# Patient Record
Sex: Male | Born: 1985 | Race: White | Hispanic: No | Marital: Married | Smoking: Never smoker
Health system: Southern US, Community
[De-identification: ages and names within clinical notes are randomized; demographics above are authoritative.]

---
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
## 2009-01-16 ENCOUNTER — Emergency Department: Payer: Self-pay | Admitting: Emergency Medicine

## 2012-12-30 ENCOUNTER — Emergency Department: Payer: Self-pay | Admitting: Emergency Medicine

## 2013-01-07 ENCOUNTER — Emergency Department: Payer: Self-pay | Admitting: Emergency Medicine
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
## 2016-07-25 ENCOUNTER — Other Ambulatory Visit: Payer: Self-pay | Admitting: *Deleted

## 2016-07-25 DIAGNOSIS — Z0000 Encounter for general adult medical examination without abnormal findings: Secondary | ICD-10-CM

## 2016-07-25 NOTE — Progress Notes (Signed)
Hep B Titer needed to verify immunity after Hep B vaccine series. Reports labs drawn recently at [facility]. Unsure if Hep B Titer was one of them. Care Everywhere utilized to check. Only Hep C drawn. B titer drawn today. Manual lab requisition completed. Will f/u with pt after results received.

## 2016-11-05 ENCOUNTER — Ambulatory Visit: Payer: Self-pay | Admitting: Physician Assistant

## 2016-11-05 ENCOUNTER — Encounter: Payer: Self-pay | Admitting: Physician Assistant

## 2016-11-05 VITALS — BP 130/90 | HR 87 | Temp 99.1°F

## 2016-11-05 DIAGNOSIS — J029 Acute pharyngitis, unspecified: Secondary | ICD-10-CM

## 2016-11-05 LAB — POCT RAPID STREP A (OFFICE): Rapid Strep A Screen: NEGATIVE

## 2016-11-05 MED ORDER — DEXAMETHASONE SODIUM PHOSPHATE 10 MG/ML IJ SOLN
10.0000 mg | Freq: Once | INTRAMUSCULAR | Status: AC
  Administered 2016-11-05: 10 mg via INTRAMUSCULAR

## 2016-11-05 MED ORDER — AMOXICILLIN 875 MG PO TABS: 875.0000 mg | ORAL_TABLET | Freq: Two times a day (BID) | ORAL | 0 refills | Status: DC

## 2016-11-05 NOTE — Progress Notes (Signed)
S: c/o sore throat for 2 days with fever/chills, hurts to swallow, hasn't been able to eat for last 2 days, no cough or congestion, no cp/sob  O: vitals wnl, nad, tms clear, throat a little red, no exudate, tonsils are minimally enlarged, neck supple no lymph, lungs c t a, cv rrr, q strep negative  A: acute pharyngitis  P: decadron 10mg  Im, amoxil 875mg  bid, told pt we do not give narcotics for sore throats, states he understands

## 2020-10-29 ENCOUNTER — Encounter (HOSPITAL_BASED_OUTPATIENT_CLINIC_OR_DEPARTMENT_OTHER): Payer: Self-pay | Admitting: Emergency Medicine

## 2020-10-29 ENCOUNTER — Emergency Department
Admission: EM | Admit: 2020-10-29 | Discharge: 2020-10-29 | Disposition: A | Discharge status: Home / Self Care | Payer: Self-pay | Attending: Emergency Medicine | Admitting: Emergency Medicine

## 2020-10-29 ENCOUNTER — Encounter: Payer: Self-pay | Admitting: *Deleted

## 2020-10-29 ENCOUNTER — Other Ambulatory Visit: Payer: Self-pay

## 2020-10-29 DIAGNOSIS — Z5321 Procedure and treatment not carried out due to patient leaving prior to being seen by health care provider: Secondary | ICD-10-CM | POA: Insufficient documentation

## 2020-10-29 DIAGNOSIS — W268XXA Contact with other sharp object(s), not elsewhere classified, initial encounter: Secondary | ICD-10-CM | POA: Diagnosis not present

## 2020-10-29 DIAGNOSIS — S91321A Laceration with foreign body, right foot, initial encounter: Secondary | ICD-10-CM | POA: Insufficient documentation

## 2020-10-29 DIAGNOSIS — S91311A Laceration without foreign body, right foot, initial encounter: Secondary | ICD-10-CM | POA: Insufficient documentation

## 2020-10-29 DIAGNOSIS — X58XXXA Exposure to other specified factors, initial encounter: Secondary | ICD-10-CM | POA: Insufficient documentation

## 2020-10-29 DIAGNOSIS — S99921A Unspecified injury of right foot, initial encounter: Secondary | ICD-10-CM | POA: Diagnosis present

## 2020-10-29 NOTE — ED Triage Notes (Signed)
Laceration to top of R foot, unknown object. Pt is updated on tetnus w/I last month. Pt last took ibuprofen @ 1900.
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
## 2020-10-30 ENCOUNTER — Emergency Department (HOSPITAL_BASED_OUTPATIENT_CLINIC_OR_DEPARTMENT_OTHER)
Admission: EM | Admit: 2020-10-30 | Discharge: 2020-10-30 | Disposition: A | Discharge status: Home / Self Care | Payer: BC Managed Care – PPO | Attending: Emergency Medicine | Admitting: Emergency Medicine

## 2020-10-30 DIAGNOSIS — S91311A Laceration without foreign body, right foot, initial encounter: Secondary | ICD-10-CM

## 2020-10-30 MED ORDER — CEPHALEXIN 500 MG PO CAPS: 500.0000 mg | ORAL_CAPSULE | Freq: Two times a day (BID) | ORAL | 0 refills | Status: AC

## 2020-10-30 MED ORDER — LIDOCAINE-EPINEPHRINE (PF) 2 %-1:200000 IJ SOLN
20.0000 mL | Freq: Once | INTRAMUSCULAR | Status: AC
  Administered 2020-10-30: 20 mL
  Filled 2020-10-30: qty 20

## 2020-10-30 NOTE — ED Notes (Signed)
Pt verbalizes understanding of discharge instructions. Opportunity for questioning and answers were provided. Armand removed by staff, pt discharged from ED to home. Educated to pick up Rx and wound care 

## 2020-10-30 NOTE — ED Notes (Signed)
MD at bedside for lac repair
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
## 2021-03-20 ENCOUNTER — Encounter: Payer: Self-pay | Admitting: Emergency Medicine

## 2021-03-20 ENCOUNTER — Ambulatory Visit
Admission: EM | Admit: 2021-03-20 | Discharge: 2021-03-20 | Disposition: A | Discharge status: Home / Self Care | Payer: BC Managed Care – PPO | Attending: Emergency Medicine | Admitting: Emergency Medicine

## 2021-03-20 DIAGNOSIS — R350 Frequency of micturition: Secondary | ICD-10-CM | POA: Diagnosis not present

## 2021-03-20 LAB — POCT URINALYSIS DIP (MANUAL ENTRY)
Bilirubin, UA: NEGATIVE
Blood, UA: NEGATIVE
Glucose, UA: NEGATIVE mg/dL
Ketones, POC UA: NEGATIVE mg/dL
Leukocytes, UA: NEGATIVE
Nitrite, UA: NEGATIVE
Protein Ur, POC: NEGATIVE mg/dL
Spec Grav, UA: 1.01 (ref 1.010–1.025)
Urobilinogen, UA: 0.2 E.U./dL
pH, UA: 7 (ref 5.0–8.0)

## 2021-03-20 NOTE — ED Provider Notes (Signed)
[facility]    CSN: [id number] Arrival date & time: 03/20/21  1429      History   Chief Complaint Chief Complaint  Patient presents with   Urinary Frequency    HPI Fred Oneill is a 35 y.o. male.  Patient presents with 1 week history of urinary frequency and urgency.  He denies fever, chills, dysuria, hematuria, abdominal pain, flank pain, penile discharge, testicular pain, rash, or other symptoms.  No treatments at home.  No pertinent medical history.  He is sexually active in a monogamous relationship.    The history is provided by the patient.   History reviewed. No pertinent past medical history.  There are no problems to display for this patient.   History reviewed. No pertinent surgical history.     Home Medications    Prior to Admission medications   Medication Sig Start Date End Date Taking? Authorizing Provider  cephALEXin (KEFLEX) 500 MG capsule Take 1 capsule (500 mg total) by mouth 2 (two) times daily. 10/30/20   Zadie Rhine, MD  sildenafil (REVATIO) 20 MG tablet TAKE 3 TABLETS (60 MG) ORALLY DAILY AS NEEDED 1 HOUR BEFORE SEX (MAX 3/DAY) 10/29/16   [provider]    Family History History reviewed. No pertinent family history.  Social History Social History   Tobacco Use   Smoking status: Never   Smokeless tobacco: Never  Vaping Use   Vaping Use: Never used  Substance Use Topics   Alcohol use: Yes     Allergies   Sulfa antibiotics   Review of Systems Review of Systems  Constitutional:  Negative for chills and fever.  Gastrointestinal:  Negative for abdominal pain and vomiting.  Genitourinary:  Positive for frequency and urgency. Negative for dysuria, flank pain, hematuria, penile discharge and testicular pain.  Skin:  Negative for color change and rash.  All other systems reviewed and are negative.   Physical Exam Triage Vital Signs ED Triage Vitals  Enc Vitals Group     BP      Pulse      Resp      Temp       Temp src      SpO2      Weight      Height      Head Circumference      Peak Flow      Pain Score      Pain Loc      Pain Edu?      Excl. in GC?    No data found.  Updated Vital Signs BP (!) 152/86 (BP Location: Left Arm)   Pulse 72   Temp 97.8 F (36.6 C) (Oral)   Resp 18   SpO2 96%   Visual Acuity Right Eye Distance:   Left Eye Distance:   Bilateral Distance:    Right Eye Near:   Left Eye Near:    Bilateral Near:     Physical Exam Vitals and nursing note reviewed.  Constitutional:      General: He is not in acute distress.    Appearance: He is well-developed. He is not ill-appearing.  HENT:     Mouth/Throat:     Mouth: Mucous membranes are moist.  Cardiovascular:     Rate and Rhythm: Normal rate and regular rhythm.     Heart sounds: Normal heart sounds.  Pulmonary:     Effort: Pulmonary effort is normal. No respiratory distress.     Breath sounds: Normal breath sounds.  Abdominal:     General: Bowel sounds are normal.     Palpations: Abdomen is soft.     Tenderness: There is no abdominal tenderness. There is no right CVA tenderness, left CVA tenderness, guarding or rebound.  Musculoskeletal:     Cervical back: Neck supple.  Skin:    General: Skin is warm and dry.  Neurological:     Mental Status: He is alert.  Psychiatric:        Mood and Affect: Mood normal.        Behavior: Behavior normal.     UC Treatments / Results  Labs (all labs ordered are listed, but only abnormal results are displayed) Labs Reviewed  POCT URINALYSIS DIP (MANUAL ENTRY)  CYTOLOGY, (ORAL, ANAL, URETHRAL) ANCILLARY ONLY    EKG   Radiology No results found.  Procedures Procedures (including critical care time)  Medications Ordered in UC Medications - No data to display  Initial Impression / Assessment and Plan / UC Course  I have reviewed the triage vital signs and the nursing notes.  Pertinent labs & imaging results that were available during my care of  the patient were reviewed by me and considered in my medical decision making (see chart for details).   Urinary frequency.  Urine normal.  Patient obtained self swab for testing.  Discussed that we will call if test results are positive.  Discussed that he may require treatment if test results are positive.  Discussed that sexual partner may also require treatment.  Instructed patient to abstain from sexual activity for at least 7 days.  Instructed him to follow-up with PCP if symptoms are not improving.  Patient agrees to plan of care.    Final Clinical Impressions(s) / UC Diagnoses   Final diagnoses:  Urinary frequency     Discharge Instructions      Your urine does not show signs of infection.    Your other tests are pending.  If your test results are positive, we will call you.  Do not have sexual activity for at least 7 days.    Establish a primary care provider and follow up if your symptoms are not improving.           ED Prescriptions   None    PDMP not reviewed this encounter.   Sharion Balloon, NP 03/20/21 [phone]

## 2021-03-20 NOTE — Discharge Instructions (Addendum)
Your urine does not show signs of infection.    Your other tests are pending.  If your test results are positive, we will call you.  Do not have sexual activity for at least 7 days.    Establish a primary care provider and follow up if your symptoms are not improving.

## 2021-03-20 NOTE — ED Triage Notes (Signed)
Pt c/o urinary frequency and urgency x 1 week.

## 2021-03-21 LAB — CYTOLOGY, (ORAL, ANAL, URETHRAL) ANCILLARY ONLY
Chlamydia: NEGATIVE
Comment: NEGATIVE
Comment: NEGATIVE
Comment: NORMAL
Neisseria Gonorrhea: NEGATIVE
Trichomonas: NEGATIVE

## 2022-01-18 ENCOUNTER — Other Ambulatory Visit: Payer: Self-pay | Admitting: Urology

## 2022-01-18 DIAGNOSIS — N50812 Left testicular pain: Secondary | ICD-10-CM

## 2022-01-25 ENCOUNTER — Ambulatory Visit: Admission: RE | Admit: 2022-01-25 | Payer: Managed Care, Other (non HMO) | Source: Ambulatory Visit

## 2022-07-17 ENCOUNTER — Ambulatory Visit
Admission: EM | Admit: 2022-07-17 | Discharge: 2022-07-17 | Disposition: A | Discharge status: Home / Self Care | Payer: Managed Care, Other (non HMO) | Attending: Urgent Care | Admitting: Urgent Care

## 2022-07-17 DIAGNOSIS — R0789 Other chest pain: Secondary | ICD-10-CM | POA: Diagnosis not present

## 2022-07-17 NOTE — ED Provider Notes (Signed)
[name]    CSN: [id number] Arrival date & time: 07/17/22  1604      History   Chief Complaint Chief Complaint  Patient presents with   Dizziness    HPI Fred Oneill is a 37 y.o. male.    Dizziness   Presents to urgent care with report of "lightheadedness" accompanied by sensation on left side of chest that he has difficulty describing.  Symptoms starting 4 days ago.  Patient states he felt as if he was going to "pass out" earlier when sitting in a chair.  Now endorses feeling better.  Also endorses "chest pain" when taking a deep breath which is chronic and reproducible.  History reviewed. No pertinent past medical history.  There are no problems to display for this patient.   History reviewed. No pertinent surgical history.     Home Medications    Prior to Admission medications   Medication Sig Start Date End Date Taking? Authorizing Provider  cephALEXin (KEFLEX) 500 MG capsule Take 1 capsule (500 mg total) by mouth 2 (two) times daily. 10/30/20   Fred Rhine, MD  sildenafil (REVATIO) 20 MG tablet TAKE 3 TABLETS (60 MG) ORALLY DAILY AS NEEDED 1 HOUR BEFORE SEX (MAX 3/DAY) 10/29/16   [provider]    Family History History reviewed. No pertinent family history.  Social History Social History   Tobacco Use   Smoking status: Never   Smokeless tobacco: Never  Vaping Use   Vaping Use: Never used  Substance Use Topics   Alcohol use: Yes   Drug use: Never     Allergies   Sulfa antibiotics   Review of Systems Review of Systems  Neurological:  Positive for dizziness.     Physical Exam Triage Vital Signs ED Triage Vitals [07/17/22 1655]  Enc Vitals Group     BP (!) 151/105     Pulse Rate 95     Resp 18     Temp 98.3 F (36.8 C)     Temp Source Oral     SpO2 96 %     Weight      Height      Head Circumference      Peak Flow      Pain Score      Pain Loc      Pain Edu?      Excl. in GC?    No data  found.  Updated Vital Signs BP (!) 160/89 (BP Location: Left Arm)   Pulse 95   Temp 98.3 F (36.8 C) (Oral)   Resp 18   SpO2 96%   Visual Acuity Right Eye Distance:   Left Eye Distance:   Bilateral Distance:    Right Eye Near:   Left Eye Near:    Bilateral Near:     Physical Exam Vitals reviewed.  Constitutional:      Appearance: Normal appearance.  HENT:     Ears:     Comments: Negative Epley Cardiovascular:     Rate and Rhythm: Normal rate and regular rhythm.     Pulses: Normal pulses.     Heart sounds: Normal heart sounds.  Pulmonary:     Effort: Pulmonary effort is normal.     Breath sounds: Normal breath sounds.  Skin:    General: Skin is warm and dry.  Neurological:     General: No focal deficit present.     Mental Status: He is alert and oriented to person, place, and time.  Coordination: Romberg sign negative.  Psychiatric:        Mood and Affect: Mood normal.        Behavior: Behavior normal.     UC Treatments / Results  Labs (all labs ordered are listed, but only abnormal results are displayed) Labs Reviewed - No data to display  EKG   Radiology No results found.  Procedures Procedures (including critical care time)  Medications Ordered in UC Medications - No data to display  Initial Impression / Assessment and Plan / UC Course  I have reviewed the triage vital signs and the nursing notes.  Pertinent labs & imaging results that were available during my care of the patient were reviewed by me and considered in my medical decision making (see chart for details).   Patient is afebrile here without recent antipyretics. Satting well on room air. Overall is well appearing, well hydrated, without respiratory distress. Pulmonary exam is unremarkable.  Lungs CTAB without wheezing, rhonchi, rales.  ECG shows sinus rhythm with occasional PVCs which the patient is aware of. He denies symptomatic. Provided reassurance to patient and wife regarding  benign nature of this arrhythmia. Recommended he perform diet diary and identify substances which might act as stimulants and provide increased occurrences. Also recommended that he establish care with PCP who can perform full physical assessment.  Final Clinical Impressions(s) / UC Diagnoses   Final diagnoses:  None   Discharge Instructions   None    ED Prescriptions   None    PDMP not reviewed this encounter.   Charma Igo, [place] 07/17/22 1757

## 2022-07-17 NOTE — Discharge Instructions (Signed)
Follow up with your primary care provider if your symptoms are worsening or not improving.    

## 2022-07-17 NOTE — ED Triage Notes (Signed)
Lightheaded, dizziness, pain in left side of chest when taking a breath, that started 4 days ago. Today he felt like he was going to pass out when siting in chair, now is feeling better.
